# Patient Record
Sex: Male | Born: 1975 | Race: White | Hispanic: No | Marital: Married | Smoking: Never smoker
Health system: Southern US, Community
[De-identification: ages and names within clinical notes are randomized; demographics above are authoritative.]

## PROBLEM LIST (undated history)

## (undated) DIAGNOSIS — R7989 Other specified abnormal findings of blood chemistry: Secondary | ICD-10-CM

## (undated) DIAGNOSIS — J45909 Unspecified asthma, uncomplicated: Secondary | ICD-10-CM

## (undated) HISTORY — DX: Other specified abnormal findings of blood chemistry: R79.89

## (undated) HISTORY — DX: Unspecified asthma, uncomplicated: J45.909

---

## 2015-12-12 ENCOUNTER — Ambulatory Visit: Payer: Self-pay | Admitting: Urology

## 2015-12-25 ENCOUNTER — Encounter: Payer: Self-pay | Admitting: Urology

## 2015-12-25 ENCOUNTER — Ambulatory Visit (INDEPENDENT_AMBULATORY_CARE_PROVIDER_SITE_OTHER): Payer: 59 | Admitting: Urology

## 2015-12-25 VITALS — BP 144/89 | HR 62 | Ht 69.0 in | Wt 186.4 lb

## 2015-12-25 DIAGNOSIS — N529 Male erectile dysfunction, unspecified: Secondary | ICD-10-CM

## 2015-12-25 DIAGNOSIS — Z8042 Family history of malignant neoplasm of prostate: Secondary | ICD-10-CM

## 2015-12-25 DIAGNOSIS — N401 Enlarged prostate with lower urinary tract symptoms: Secondary | ICD-10-CM

## 2015-12-25 DIAGNOSIS — R7989 Other specified abnormal findings of blood chemistry: Secondary | ICD-10-CM

## 2015-12-25 DIAGNOSIS — E291 Testicular hypofunction: Secondary | ICD-10-CM

## 2015-12-25 DIAGNOSIS — N528 Other male erectile dysfunction: Secondary | ICD-10-CM | POA: Diagnosis not present

## 2015-12-25 DIAGNOSIS — N138 Other obstructive and reflux uropathy: Secondary | ICD-10-CM

## 2015-12-25 NOTE — Progress Notes (Signed)
12/25/2015 9:09 AM   Netty Starring 06/23/1975 [id number]  Referring provider: No referring provider defined for this encounter.  Chief Complaint  Patient presents with  . New Patient (Initial Visit)    Low Testosterone referred by Dr. Dareen Piano    HPI: Patient is a 40 year old Caucasian male who is referred to [place] by Dr. Dareen Piano for low testosterone.  Hypogonadism Patient is experiencing a decrease in libido, erections being less strong and falling asleep.   This is indicated by his responses to the ADAM questionnaire.   He is still having/no longer having spontaneous erections at night.   He denies sleep apnea.   He was managing his hypogonadism with testosterone cypionate which he injected himself.  He last shot was 4 months ago.        Androgen Deficiency in the Aging Male    Row Name 12/25/15 0800         Androgen Deficiency in the Aging Male   Do you have a decrease in libido (sex drive) Yes     Do you have lack of energy No     Do you have a decrease in strength and/or endurance No     Have you lost height No     Have you noticed a decreased "enjoyment of life" No     Are you sad and/or grumpy No     Are your erections less strong Yes     Have you noticed a recent deterioration in your ability to play sports No     Are you falling asleep after dinner Yes     Has there been a recent deterioration in your work performance No        BPH WITH LUTS His IPSS score today is 1, which is mild lower urinary tract symptomatology. He is delighted with his quality life due to his urinary symptoms.  His major complaint today is frequency, but it has not bothersome to him.  He has had these symptoms for a few years.  He denies any dysuria, hematuria or suprapubic pain.   He also denies any recent fevers, chills, nausea or vomiting.  His father was diagnosed with prostate cancer at age 6.  He underwent a prostatectomy.   No radiation.  No ADT.  He is still alive.  His paternal  uncles also have prostate cancer.        IPSS    Row Name 12/25/15 0800         International Prostate Symptom Score   How often have you had the sensation of not emptying your bladder? Not at All     How often have you had to urinate less than every two hours? Less than 1 in 5 times     How often have you found you stopped and started again several times when you urinated? Not at All     How often have you found it difficult to postpone urination? Not at All     How often have you had a weak urinary stream? Not at All     How often have you had to strain to start urination? Not at All     How many times did you typically get up at night to urinate? None     Total IPSS Score 1       Quality of Life due to urinary symptoms   If you were to spend the rest of your life with your urinary condition just  the way it is now how would you feel about that? Pleased        Score:  1-7 Mild 8-19 Moderate 20-35 Severe  Erectile dysfunction His SHIM score is 5, which is severe ED.   He has been having difficulty with erections for several years.   His major complaint is no erections.  His libido is diminished.   His risk factors for ED are age, BPH, hypogonadism and HTN.   He denies any painful erections or curvatures with his erections.   He has tried Viagra in the past, but it caused a very severe headache.        SHIM    Row Name 12/25/15 [phone]         SHIM: Over the last 6 months:   How do you rate your confidence that you could get and keep an erection? Very Low     When you had erections with sexual stimulation, how often were your erections hard enough for penetration (entering your partner)? Almost Never or Never     During sexual intercourse, how often were you able to maintain your erection after you had penetrated (entered) your partner? Extremely Difficult     During sexual intercourse, how difficult was it to maintain your erection to completion of intercourse? Extremely  Difficult     When you attempted sexual intercourse, how often was it satisfactory for you? Extremely Difficult       SHIM Total Score   SHIM 5        Score: 1-7 Severe ED 8-11 Moderate ED 12-16 Mild-Moderate ED 17-21 Mild ED 22-25 No ED   PMH: Past Medical History:  Diagnosis Date  . Asthma   . Low testosterone     Surgical History: History reviewed. No pertinent surgical history.  Home Medications:    Medication List       Accurate as of 12/25/15  9:09 AM. Always use your most recent med list.          albuterol 108 (90 Base) MCG/ACT inhaler Commonly known as:  PROVENTIL HFA;VENTOLIN HFA Inhale into the lungs.   DEPO-TESTOSTERONE 200 MG/ML injection Generic drug:  testosterone cypionate 1 mL every 14 (fourteen) days.   Loratadine 10 MG Caps Take by mouth.   sildenafil 20 MG tablet Commonly known as:  REVATIO Take 20 mg by mouth.       Allergies: No Known Allergies  Family History: Family History  Problem Relation Age of Onset  . Prostate cancer Father   . Prostate cancer Paternal Uncle     x 5  . Kidney disease Neg Hx     Social History:  reports that he has never smoked. He has never used smokeless tobacco. He reports that he does not drink alcohol or use drugs.  ROS: UROLOGY Frequent Urination?: No Hard to postpone urination?: No Burning/pain with urination?: No Get up at night to urinate?: No Leakage of urine?: No Urine stream starts and stops?: No Trouble starting stream?: No Do you have to strain to urinate?: No Blood in urine?: No Urinary tract infection?: No Sexually transmitted disease?: No Injury to kidneys or bladder?: No Painful intercourse?: No Weak stream?: No Erection problems?: Yes Penile pain?: No  Gastrointestinal Nausea?: No Vomiting?: No Indigestion/heartburn?: No Diarrhea?: No Constipation?: No  Constitutional Fever: No Night sweats?: No Weight loss?: No Fatigue?: No  Skin Skin rash/lesions?:  No Itching?: No  Eyes Blurred vision?: No Double vision?: No  Ears/Nose/Throat Sore throat?: No Sinus  problems?: No  Hematologic/Lymphatic Swollen glands?: No Easy bruising?: No  Cardiovascular Leg swelling?: No Chest pain?: No  Respiratory Cough?: No Shortness of breath?: No  Endocrine Excessive thirst?: No  Musculoskeletal Back pain?: No Joint pain?: No  Neurological Headaches?: No Dizziness?: No  Psychologic Depression?: No Anxiety?: No  Physical Exam: BP (!) 144/89   Pulse 62   Ht 5\' 9"  (1.753 m)   Wt 186 lb 6.4 oz (84.6 kg)   BMI 27.53 kg/m   Constitutional: Well nourished. Alert and oriented, No acute distress. HEENT: [place] AT, moist mucus membranes. Trachea midline, no masses. Cardiovascular: No clubbing, cyanosis, or edema. Respiratory: Normal respiratory effort, no increased work of breathing. GI: Abdomen is soft, non tender, non distended, no abdominal masses. Liver and spleen not palpable.  No hernias appreciated.  Stool sample for occult testing is not indicated.   GU: No CVA tenderness.  No bladder fullness or masses.  Patient with circumcised phallus.  Urethral meatus is patent.  No penile discharge. No penile lesions or rashes. Scrotum without lesions, cysts, rashes and/or edema.  Testicles are located scrotally bilaterally. No masses are appreciated in the testicles. Left and right epididymis are normal. Rectal: Patient with  normal sphincter tone. Anus and perineum without scarring or rashes. No rectal masses are appreciated. Prostate is approximately 45 grams, no nodules are appreciated. Seminal vesicles are normal. Skin: No rashes, bruises or suspicious lesions. Lymph: No cervical or inguinal adenopathy. Neurologic: Grossly intact, no focal deficits, moving all 4 extremities. Psychiatric: Normal mood and affect.    Assessment & Plan:    1. Low testosterone  - I explained to patient that the current recommendations from the Endocrine  Society reports the diagnosis of hypogonadism requires a serum total testosterone level obtained between 8 and 10 AM at least 2 days apart that is below the laboratory parameters  for normal testosterone.      - At this time, the patient does not meet this requirement.  Morning testosterone drawn today.  He will then return next week for his second serum testosterone draw before 10 AM.  -I discussed with the patient the side effects of testosterone therapy, such as: enlargement of the prostate gland that may in turn cause LUTS, possible increased risk of PCa, DVT's and/or PE's, possible increased risk of heart attack or stroke, lower sperm count, swelling of the ankles, feet, or body, with or without heart failure, enlarged or painful breasts, have problems breathing while you sleep (sleep apnea), increased prostate specific antigen, mood swings, hypertension and increased red blood cell count.  - I also discussed that some men have had success using clomid for hypogonadism.  It does seem to be more successful in younger men, but there are incidences of good results in middle-aged men.  I explained that it is used in male infertility to stimulate the testicles to make more testosterone/sperm.  There has been no long term data on side effects, but some urologists has been having success with this medication.   - Testosterone  - PSA  - Hematocrit  - Hepatic function panel  - He will speak with Dr. Dareen Piano regarding his risks for sleep apnea   2. BPH with LUTS  - IPSS score is 1/0  - Continue conservative management, avoiding bladder irritants and timed voiding's  - RTC in 6 months for IPSS, PSA and exam   3. Family history of prostate cancer  - RTC in 6 months for PSA and exam  4. Erectile  dysfunction:  SHIM score is 5.   I explained to the patient that in order to achieve an erection it takes good functioning of the nervous system (parasympathetic, sympathetic, sensory and motor), good blood  flow into the erectile tissue of the penis and a desire to have sex.   I stated that conditions like diabetes, hypertension, coronary artery disease, peripheral vascular disease, smoking, alcohol consumption, age and BPH can diminish the ability to have an erection.   We discussed trying a different PDE5 inhibitor, intra-urethral suppositories, intracavernous vasoactive drug injection therapy, vacuum constriction device and penile prosthesis implantation.    - samples given for Stendra  - RTC in 6 months for SHIM and exam    Return for next week before 10AM for testosterone draw.  These notes generated with voice recognition software. I apologize for typographical errors.  Michiel CowboySHANNON Dequita Schleicher, PA-C  [facility] [address], [address] [place], [place] [address] [phone]

## 2015-12-26 ENCOUNTER — Telehealth: Payer: Self-pay

## 2015-12-26 LAB — HEMATOCRIT: Hematocrit: 45.9 % (ref 37.5–51.0)

## 2015-12-26 LAB — HEPATIC FUNCTION PANEL
ALBUMIN: 4.8 g/dL (ref 3.5–5.5)
ALT: 38 IU/L (ref 0–44)
AST: 23 IU/L (ref 0–40)
Alkaline Phosphatase: 84 IU/L (ref 39–117)
BILIRUBIN TOTAL: 0.9 mg/dL (ref 0.0–1.2)
Bilirubin, Direct: 0.2 mg/dL (ref 0.00–0.40)
Total Protein: 7.2 g/dL (ref 6.0–8.5)

## 2015-12-26 LAB — TESTOSTERONE: Testosterone: 396 ng/dL (ref 264–916)

## 2015-12-26 LAB — PSA: PROSTATE SPECIFIC AG, SERUM: 0.8 ng/mL (ref 0.0–4.0)

## 2015-12-26 NOTE — Telephone Encounter (Signed)
Spoke with pt in reference to lab results. Made pt aware testosterone result is normal at this time. Reinforced with pt to keep next lab appt. Pt voiced understanding.

## 2015-12-26 NOTE — Telephone Encounter (Signed)
-----   Message from Harle BattiestShannon A McGowan, PA-C sent at 12/26/2015  8:30 AM EDT ----- Patient's PSA and LFT's are normal.  His testosterone is also in the normal range, so it doesn't meet the criteria for low testosterone.  I believe he is coming in next week for a second testosterone draw.  He should keep that lab appointment.

## 2015-12-26 NOTE — Telephone Encounter (Signed)
LMOM

## 2015-12-30 ENCOUNTER — Other Ambulatory Visit: Payer: 59

## 2015-12-30 ENCOUNTER — Other Ambulatory Visit: Payer: Self-pay

## 2015-12-30 DIAGNOSIS — E291 Testicular hypofunction: Secondary | ICD-10-CM

## 2015-12-31 ENCOUNTER — Telehealth: Payer: Self-pay

## 2015-12-31 LAB — TESTOSTERONE: TESTOSTERONE: 379 ng/dL (ref 264–916)

## 2015-12-31 NOTE — Telephone Encounter (Signed)
Spoke with pt in reference to lab results and sleep study. Pt voiced understanding. Pt stated that he will call PCP and then let us know.

## 2015-12-31 NOTE — Telephone Encounter (Signed)
LMOM

## 2015-12-31 NOTE — Telephone Encounter (Signed)
-----   Message from Harle BattiestShannon A McGowan, PA-C sent at 12/31/2015 12:56 PM EDT ----- Testosterone is still within the normal lab parameters, he does not meet the criteria for low testosterone.  I suggest that he be evaluated for sleep apnea and not take any testosterone medication until this evaluation is complete.

## 2016-01-24 ENCOUNTER — Emergency Department: Payer: No Typology Code available for payment source

## 2016-01-24 ENCOUNTER — Emergency Department
Admission: EM | Admit: 2016-01-24 | Discharge: 2016-01-24 | Disposition: A | Discharge status: Home / Self Care | Payer: No Typology Code available for payment source | Attending: Emergency Medicine | Admitting: Emergency Medicine

## 2016-01-24 ENCOUNTER — Encounter: Payer: Self-pay | Admitting: *Deleted

## 2016-01-24 DIAGNOSIS — Z79899 Other long term (current) drug therapy: Secondary | ICD-10-CM | POA: Diagnosis not present

## 2016-01-24 DIAGNOSIS — T22012A Burn of unspecified degree of left forearm, initial encounter: Secondary | ICD-10-CM | POA: Insufficient documentation

## 2016-01-24 DIAGNOSIS — S51812A Laceration without foreign body of left forearm, initial encounter: Secondary | ICD-10-CM | POA: Diagnosis not present

## 2016-01-24 DIAGNOSIS — G8911 Acute pain due to trauma: Secondary | ICD-10-CM

## 2016-01-24 DIAGNOSIS — M25562 Pain in left knee: Secondary | ICD-10-CM | POA: Diagnosis not present

## 2016-01-24 DIAGNOSIS — Y9389 Activity, other specified: Secondary | ICD-10-CM | POA: Diagnosis not present

## 2016-01-24 DIAGNOSIS — S39012A Strain of muscle, fascia and tendon of lower back, initial encounter: Secondary | ICD-10-CM

## 2016-01-24 DIAGNOSIS — Y92410 Unspecified street and highway as the place of occurrence of the external cause: Secondary | ICD-10-CM | POA: Insufficient documentation

## 2016-01-24 DIAGNOSIS — Y999 Unspecified external cause status: Secondary | ICD-10-CM | POA: Diagnosis not present

## 2016-01-24 DIAGNOSIS — J45909 Unspecified asthma, uncomplicated: Secondary | ICD-10-CM | POA: Insufficient documentation

## 2016-01-24 DIAGNOSIS — S299XXA Unspecified injury of thorax, initial encounter: Secondary | ICD-10-CM | POA: Diagnosis present

## 2016-01-24 DIAGNOSIS — S2231XA Fracture of one rib, right side, initial encounter for closed fracture: Secondary | ICD-10-CM | POA: Diagnosis not present

## 2016-01-24 MED ORDER — NAPROXEN 500 MG PO TABS
ORAL_TABLET | ORAL | Status: AC
  Filled 2016-01-24: qty 1

## 2016-01-24 MED ORDER — CYCLOBENZAPRINE HCL 10 MG PO TABS
ORAL_TABLET | ORAL | Status: AC
  Filled 2016-01-24: qty 1

## 2016-01-24 MED ORDER — NAPROXEN 500 MG PO TABS: 500.0000 mg | ORAL_TABLET | Freq: Two times a day (BID) | ORAL | 0 refills | Status: DC

## 2016-01-24 MED ORDER — CYCLOBENZAPRINE HCL 10 MG PO TABS: 10.0000 mg | ORAL_TABLET | Freq: Three times a day (TID) | ORAL | 0 refills | Status: DC | PRN

## 2016-01-24 MED ORDER — CYCLOBENZAPRINE HCL 10 MG PO TABS
10.0000 mg | ORAL_TABLET | Freq: Once | ORAL | Status: AC
  Administered 2016-01-24: 10 mg via ORAL

## 2016-01-24 MED ORDER — NAPROXEN 500 MG PO TABS
500.0000 mg | ORAL_TABLET | Freq: Once | ORAL | Status: AC
  Administered 2016-01-24: 500 mg via ORAL
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
## 2016-05-21 ENCOUNTER — Other Ambulatory Visit: Payer: Self-pay
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
## 2018-07-11 DIAGNOSIS — Z0000 Encounter for general adult medical examination without abnormal findings: Secondary | ICD-10-CM | POA: Diagnosis not present

## 2018-07-11 DIAGNOSIS — R5383 Other fatigue: Secondary | ICD-10-CM | POA: Diagnosis not present

## 2018-07-11 DIAGNOSIS — J4520 Mild intermittent asthma, uncomplicated: Secondary | ICD-10-CM | POA: Diagnosis not present

## 2019-10-19 NOTE — Progress Notes (Signed)
10/20/2019 9:34 AM   Netty Starring [birth year] [id number]  Referring provider: Lauro Regulus, MD [address] [facility] [place],  [place] [address] Chief Complaint  Patient presents with  . Hypogonadism    HPI: Dean Robinson is a 44 y.o. male presents today to establish local urologic care  -He was followed by [facility] urologist Dr. Achilles Dunk from 2014-2016.  -He is a former patient of Michiel Cowboy, PA-C and last seen on 12/25/2015 for low testosterone. -In 2017 he reported using cypionate self injections. Today he feels fatigued and has decreased libido. -He has a history of BPH with LUTS and erectile dysfunction. -His ED symptoms are worsening. He has tried testosterone gels, viagra and cialis. Viagra and Cialis caused severe headaches. He notes cost was an issue. -He has partial erections and is unable to penetrate.  -He has a personal family history of prostate cancer. His father was diagnosed at age 5. 74/6 brothers were diagnosed with prostate cancer; one being as young as 24. -Work up on 12/25/2015 showed hematocrit 45.9, testosterone 396, PSA 0.8, hepatic panel was normal.    PMH: Past Medical History:  Diagnosis Date  . Asthma   . Low testosterone     Surgical History: History reviewed. No pertinent surgical history.  Home Medications:  Allergies as of 10/20/2019   No Known Allergies     Medication List       Accurate as of October 20, 2019  9:34 AM. If you have any questions, ask your nurse or doctor.        STOP taking these medications   Depo-Testosterone 200 MG/ML injection Generic drug: testosterone cypionate Stopped by: Riki Altes, MD   sildenafil 20 MG tablet Commonly known as: REVATIO Stopped by: Riki Altes, MD     TAKE these medications   albuterol 108 (90 Base) MCG/ACT inhaler Commonly known as: VENTOLIN HFA Inhale into the lungs.   cyclobenzaprine 10 MG tablet Commonly known as: FLEXERIL Take 1 tablet (10  mg total) by mouth 3 (three) times daily as needed for muscle spasms.   Loratadine 10 MG Caps Take by mouth.   naproxen 500 MG tablet Commonly known as: Naprosyn Take 1 tablet (500 mg total) by mouth 2 (two) times daily with a meal.       Allergies: No Known Allergies  Family History: Family History  Problem Relation Age of Onset  . Prostate cancer Father   . Prostate cancer Paternal Uncle        x 5  . Kidney disease Neg Hx     Social History:  reports that he has never smoked. He has never used smokeless tobacco. He reports that he does not drink alcohol and does not use drugs.   Physical Exam: BP (!) 150/91   Pulse 82   Ht 5\' 9"  (1.753 m)   Wt 200 lb (90.7 kg)   BMI 29.53 kg/m   Constitutional:  Alert and oriented, No acute distress. HEENT: [place] AT, moist mucus membranes.  Trachea midline, no masses. Cardiovascular: No clubbing, cyanosis, or edema. Respiratory: Normal respiratory effort, no increased work of breathing. GI: Abdomen is soft, nontender, nondistended, no abdominal masses GU: Phallus without lesions, testes descended bilaterally without masses or tenderness, normal size bilaterally.  Spermatic cord/epididymis palpably normal bilaterally Rectal: Normal sphincter tone, 35 g prostate, no nodules/tenderness. Lymph: No cervical or inguinal lymphadenopathy. Skin: No rashes, bruises or suspicious lesions. Neurologic: Grossly intact, no focal deficits, moving all  4 extremities. Psychiatric: Normal mood and affect.    Assessment & Plan:    1. Family history of prostate cancer He has a personal family history of prostate cancer. His father was diagnosed at age 31. 80/6 brothers were diagnosed with prostate cancer; one being as young as 75. PSA today, will call with results.  2. Hypogonadism  Previously on topical gels and testosterone injections He has bothersome symptoms. Testosterone, LH and prolactin today will call with results.   3. Erectile  dysfunction His ED symptoms are worsening. He has tried viagra and cialis. Viagra and Cialis caused severe headaches.  He notes cost is an issue and would like a repeat trial of tadalafil.   Intracavernosal injections and penile implant surgery was also discussed   [facility] [address], [address] [place], [place] [address] [phone]  I, Theador Hawthorne, am acting as a scribe for Dr. Lorin Picket C. Texanna Hilburn,  I have reviewed the above documentation for accuracy and completeness, and I agree with the above.   Riki Altes, MD

## 2019-10-20 ENCOUNTER — Ambulatory Visit: Payer: Commercial Managed Care - PPO | Admitting: Urology

## 2019-10-20 ENCOUNTER — Encounter: Payer: Self-pay | Admitting: Urology

## 2019-10-20 ENCOUNTER — Other Ambulatory Visit: Payer: Self-pay

## 2019-10-20 VITALS — BP 150/91 | HR 82 | Ht 69.0 in | Wt 200.0 lb

## 2019-10-20 DIAGNOSIS — N400 Enlarged prostate without lower urinary tract symptoms: Secondary | ICD-10-CM | POA: Diagnosis not present

## 2019-10-20 DIAGNOSIS — E291 Testicular hypofunction: Secondary | ICD-10-CM | POA: Diagnosis not present

## 2019-10-20 DIAGNOSIS — N529 Male erectile dysfunction, unspecified: Secondary | ICD-10-CM | POA: Diagnosis not present

## 2019-10-20 DIAGNOSIS — Z8042 Family history of malignant neoplasm of prostate: Secondary | ICD-10-CM | POA: Diagnosis not present

## 2019-10-20 MED ORDER — TADALAFIL 20 MG PO TABS: ORAL_TABLET | ORAL | 0 refills | Status: DC

## 2019-10-21 LAB — PSA: Prostate Specific Ag, Serum: 0.7 ng/mL (ref 0.0–4.0)

## 2019-10-21 LAB — LUTEINIZING HORMONE: LH: 5.7 m[IU]/mL (ref 1.7–8.6)

## 2019-10-21 LAB — TESTOSTERONE: Testosterone: 305 ng/dL (ref 264–916)

## 2019-10-21 LAB — PROLACTIN: Prolactin: 6.3 ng/mL (ref 4.0–15.2)

## 2019-10-23 ENCOUNTER — Telehealth: Payer: Self-pay | Admitting: *Deleted

## 2019-10-23 NOTE — Telephone Encounter (Signed)
Talked with [facility] , They will run free testosterone  profile 1

## 2019-10-23 NOTE — Telephone Encounter (Signed)
-----   Message from Riki Altes, MD sent at 10/22/2019 10:44 AM EDT ----- Testosterone level low normal at 305.  Insurance would not cover testosterone replacement with a level in this range.  Recommend a free testosterone  profile 1-see if this can be run on the most recent serum.  LH, prolactin and PSA were all normal

## 2019-10-24 ENCOUNTER — Telehealth: Payer: Self-pay | Admitting: Urology

## 2019-10-24 LAB — TESTOSTERONE FREE, PROFILE I
Albumin: 4.5 g/dL (ref 4.0–5.0)
Sex Hormone Binding: 24.3 nmol/L (ref 16.5–55.9)
Testost., Free, Calc: 71.2 pg/mL (ref 30.3–183.2)
Testosterone: 313 ng/dL (ref 264–916)

## 2019-10-24 LAB — SPECIMEN STATUS REPORT

## 2019-10-24 NOTE — Telephone Encounter (Signed)
Free and total testosterone levels are low normal.  Insurance will not cover testosterone replacement for levels in the low normal range.  Can consider a trial of Clomid to see if this improves his symptoms.

## 2019-10-26 NOTE — Telephone Encounter (Signed)
Notified patient as instructed, patient states you may send it to [facility]

## 2019-10-27 MED ORDER — CLOMIPHENE CITRATE 50 MG PO TABS: 25.0000 mg | ORAL_TABLET | Freq: Every day | ORAL | 0 refills | Status: DC

## 2019-10-27 NOTE — Telephone Encounter (Signed)
Left message on vm

## 2019-10-27 NOTE — Telephone Encounter (Signed)
Rx sent.  Please schedule 6-week follow-up office visit with testosterone level prior.

## 2019-12-07 ENCOUNTER — Other Ambulatory Visit: Payer: Self-pay

## 2019-12-07 DIAGNOSIS — E291 Testicular hypofunction: Secondary | ICD-10-CM

## 2019-12-08 ENCOUNTER — Other Ambulatory Visit: Payer: Self-pay

## 2019-12-08 ENCOUNTER — Other Ambulatory Visit: Payer: Commercial Managed Care - PPO

## 2019-12-08 DIAGNOSIS — E291 Testicular hypofunction: Secondary | ICD-10-CM

## 2019-12-09 LAB — TESTOSTERONE: Testosterone: 466 ng/dL (ref 264–916)

## 2019-12-13 ENCOUNTER — Ambulatory Visit: Payer: Commercial Managed Care - PPO | Admitting: Urology

## 2019-12-13 ENCOUNTER — Encounter: Payer: Self-pay | Admitting: Urology

## 2019-12-13 ENCOUNTER — Other Ambulatory Visit: Payer: Self-pay

## 2019-12-13 DIAGNOSIS — E291 Testicular hypofunction: Secondary | ICD-10-CM | POA: Diagnosis not present

## 2019-12-13 DIAGNOSIS — N529 Male erectile dysfunction, unspecified: Secondary | ICD-10-CM

## 2019-12-13 NOTE — Progress Notes (Signed)
   12/13/2019 11:17 AM   Dean Robinson Mar 17, 1976 [id number]  Referring provider: Lauro Regulus, MD [address] [facility] [place],  [place] [address]  Chief Complaint  Patient presents with  . Hypogonadism    HPI: 44 y.o. male presents for follow-up.   Initially seen 10/20/2019 with a history of hypogonadism on testosterone cypionate  Primary complaints were tiredness, fatigue and worsening ED  Testosterone levels were low normal and TRT would not be covered by insurance  His LH was normal and he elected a trial of Clomid  He does note some improvement and energy level  He was given a trial of tadalafil and used it only once without improvement  He subsequently was diagnosed with Covid and has not attempted additional dosing  His first use was prior to starting Clomid  Testosterone level 12/08/2019 improved at 466 (prior 305, 313)   PMH: Past Medical History:  Diagnosis Date  . Asthma   . Low testosterone     Surgical History: No past surgical history on file.  Home Medications:  Allergies as of 12/13/2019   No Known Allergies     Medication List       Accurate as of December 13, 2019 11:17 AM. If you have any questions, ask your nurse or doctor.        albuterol 108 (90 Base) MCG/ACT inhaler Commonly known as: VENTOLIN HFA Inhale into the lungs.   clomiPHENE 50 MG tablet Commonly known as: CLOMID Take 0.5 tablets (25 mg total) by mouth daily.   cyclobenzaprine 10 MG tablet Commonly known as: FLEXERIL Take 1 tablet (10 mg total) by mouth 3 (three) times daily as needed for muscle spasms.   Loratadine 10 MG Caps Take by mouth.   naproxen 500 MG tablet Commonly known as: Naprosyn Take 1 tablet (500 mg total) by mouth 2 (two) times daily with a meal.   tadalafil 20 MG tablet Commonly known as: CIALIS 1 tab 1 hour prior to intercourse       Allergies: No Known Allergies  Family History: Family History  Problem  Relation Age of Onset  . Prostate cancer Father   . Prostate cancer Paternal Uncle        x 5  . Kidney disease Neg Hx     Social History:  reports that he has never smoked. He has never used smokeless tobacco. He reports that he does not drink alcohol and does not use drugs.   Physical Exam: BP (!) 145/80   Pulse 72   Ht 5\' 9"  (1.753 m)   Wt 190 lb (86.2 kg)   BMI 28.06 kg/m   Constitutional:  Alert and oriented, No acute distress. HEENT: [place] AT, moist mucus membranes.  Trachea midline, no masses. Cardiovascular: No clubbing, cyanosis, or edema. Respiratory: Normal respiratory effort, no increased work of breathing. Skin: No rashes, bruises or suspicious lesions. Neurologic: Grossly intact, no focal deficits, moving all 4 extremities. Psychiatric: Normal mood and affect.   Assessment & Plan:    1.  Hypogonadism  Has noted improvement on clomiphene which he desires to continue  2.  Erectile dysfunction  Recommended he use tadalafil on at least 5 occasions to determine efficacy  Follow-up 4 months with testosterone, PSA, hematocrit   , MD  [facility] [address] [place], [place] [phone]

## 2019-12-14 ENCOUNTER — Encounter: Payer: Self-pay | Admitting: Urology

## 2019-12-14 DIAGNOSIS — N529 Male erectile dysfunction, unspecified: Secondary | ICD-10-CM | POA: Insufficient documentation

## 2019-12-14 DIAGNOSIS — E291 Testicular hypofunction: Secondary | ICD-10-CM | POA: Insufficient documentation

## 2020-02-19 ENCOUNTER — Other Ambulatory Visit: Payer: Self-pay | Admitting: *Deleted

## 2020-02-19 NOTE — Telephone Encounter (Signed)
Ok to fill 

## 2020-02-20 MED ORDER — CLOMIPHENE CITRATE 50 MG PO TABS: 25.0000 mg | ORAL_TABLET | Freq: Every day | ORAL | 1 refills | Status: DC

## 2020-04-12 ENCOUNTER — Other Ambulatory Visit: Payer: Self-pay

## 2020-04-12 ENCOUNTER — Other Ambulatory Visit: Payer: Commercial Managed Care - PPO

## 2020-04-12 DIAGNOSIS — E291 Testicular hypofunction: Secondary | ICD-10-CM

## 2020-04-13 LAB — HEMATOCRIT: Hematocrit: 47.2 % (ref 37.5–51.0)

## 2020-04-13 LAB — TESTOSTERONE: Testosterone: 644 ng/dL (ref 264–916)

## 2020-04-13 LAB — PSA: Prostate Specific Ag, Serum: 0.6 ng/mL (ref 0.0–4.0)

## 2020-04-15 ENCOUNTER — Encounter: Payer: Self-pay | Admitting: Urology

## 2020-04-15 ENCOUNTER — Other Ambulatory Visit: Payer: Self-pay

## 2020-04-15 ENCOUNTER — Ambulatory Visit (INDEPENDENT_AMBULATORY_CARE_PROVIDER_SITE_OTHER): Payer: Commercial Managed Care - PPO | Admitting: Urology

## 2020-04-15 VITALS — BP 139/88 | HR 77 | Ht 69.0 in | Wt 190.0 lb

## 2020-04-15 DIAGNOSIS — N529 Male erectile dysfunction, unspecified: Secondary | ICD-10-CM

## 2020-04-15 DIAGNOSIS — E291 Testicular hypofunction: Secondary | ICD-10-CM

## 2020-04-15 MED ORDER — TADALAFIL 20 MG PO TABS: ORAL_TABLET | ORAL | 3 refills | Status: DC

## 2020-04-15 MED ORDER — CLOMIPHENE CITRATE 50 MG PO TABS: 25.0000 mg | ORAL_TABLET | Freq: Every day | ORAL | 3 refills | Status: DC

## 2020-04-15 NOTE — Progress Notes (Signed)
   04/15/2020 11:32 AM   Dean Robinson [birth year] [id number]  Referring provider: Lauro Regulus, MD [address] [facility] [place],  [place] [address]  Chief Complaint  Patient presents with  . Hypogonadism    Urologic history:  1.  Hypogonadism -Symptoms tiredness, fatigue -Low normal testosterone levels improved on Clomid with symptom improvement  2.  Erectile dysfunction -Tadalafil prn   HPI: 45 y.o. male presents for follow-up of hypogonadism and ED.   Remains on Clomid with good energy level  Tadalafil has been effective with much less headache than sildenafil  No bothersome LUTS  Labs 04/12/2020: Testosterone 644; PSA 0.6; hematocrit 47.2   PMH: Past Medical History:  Diagnosis Date  . Asthma   . Low testosterone     Surgical History: No past surgical history on file.  Home Medications:  Allergies as of 04/15/2020   No Known Allergies     Medication List       Accurate as of April 15, 2020 11:32 AM. If you have any questions, ask your nurse or doctor.        albuterol 108 (90 Base) MCG/ACT inhaler Commonly known as: VENTOLIN HFA Inhale into the lungs.   clomiPHENE 50 MG tablet Commonly known as: CLOMID Take 0.5 tablets (25 mg total) by mouth daily.   cyclobenzaprine 10 MG tablet Commonly known as: FLEXERIL Take 1 tablet (10 mg total) by mouth 3 (three) times daily as needed for muscle spasms.   Loratadine 10 MG Caps Take by mouth.   naproxen 500 MG tablet Commonly known as: Naprosyn Take 1 tablet (500 mg total) by mouth 2 (two) times daily with a meal.   tadalafil 20 MG tablet Commonly known as: CIALIS 1 tab 1 hour prior to intercourse       Allergies: No Known Allergies  Family History: Family History  Problem Relation Age of Onset  . Prostate cancer Father   . Prostate cancer Paternal Uncle        x 5  . Kidney disease Neg Hx     Social History:  reports that he has never smoked. He has  never used smokeless tobacco. He reports that he does not drink alcohol and does not use drugs.   Physical Exam: BP 139/88 (BP Location: Left Arm, Patient Position: Sitting, Cuff Size: Large)   Pulse 77   Ht 5\' 9"  (1.753 m)   BMI 28.06 kg/m   Constitutional:  Alert and oriented, No acute distress. HEENT: [place] AT, moist mucus membranes.  Trachea midline, no masses. Cardiovascular: No clubbing, cyanosis, or edema. Respiratory: Normal respiratory effort, no increased work of breathing. Skin: No rashes, bruises or suspicious lesions. Neurologic: Grossly intact, no focal deficits, moving all 4 extremities. Psychiatric: Normal mood and affect.   Assessment & Plan:    1.  Hypogonadism  Doing well on Clomid which he desires to continue  1 year follow-up with labs and 69-month lab visit for testosterone level  Clomid refilled  2.  Erectile dysfunction  Tadalafil refilled   8-month, MD  [facility] [address] [place], [place] [id number]

## 2020-10-14 ENCOUNTER — Other Ambulatory Visit: Payer: Self-pay

## 2020-10-14 ENCOUNTER — Other Ambulatory Visit: Payer: Managed Care, Other (non HMO)

## 2020-10-14 DIAGNOSIS — E291 Testicular hypofunction: Secondary | ICD-10-CM

## 2020-10-15 ENCOUNTER — Telehealth: Payer: Self-pay | Admitting: *Deleted

## 2020-10-15 LAB — PSA: Prostate Specific Ag, Serum: 0.6 ng/mL (ref 0.0–4.0)

## 2020-10-15 LAB — TESTOSTERONE: Testosterone: 464 ng/dL (ref 264–916)

## 2020-10-15 LAB — HEMATOCRIT: Hematocrit: 48.9 % (ref 37.5–51.0)

## 2020-10-15 NOTE — Telephone Encounter (Signed)
-----   Message from Riki Altes, MD sent at 10/15/2020  2:38 PM EDT ----- Testosterone level looks good at 464.  PSA stable at 0.6 and hematocrit was normal

## 2020-10-15 NOTE — Telephone Encounter (Signed)
Notified patient as instructed, patient pleased °

## 2021-03-17 ENCOUNTER — Other Ambulatory Visit: Payer: Self-pay | Admitting: Internal Medicine

## 2021-03-19 LAB — SURGICAL PATHOLOGY

## 2021-04-02 ENCOUNTER — Other Ambulatory Visit: Payer: Self-pay | Admitting: *Deleted

## 2021-04-02 DIAGNOSIS — N400 Enlarged prostate without lower urinary tract symptoms: Secondary | ICD-10-CM

## 2021-04-02 DIAGNOSIS — E291 Testicular hypofunction: Secondary | ICD-10-CM

## 2021-04-10 ENCOUNTER — Other Ambulatory Visit: Payer: Managed Care, Other (non HMO)

## 2021-04-10 ENCOUNTER — Other Ambulatory Visit: Payer: Self-pay

## 2021-04-10 DIAGNOSIS — E291 Testicular hypofunction: Secondary | ICD-10-CM

## 2021-04-10 DIAGNOSIS — N400 Enlarged prostate without lower urinary tract symptoms: Secondary | ICD-10-CM

## 2021-04-11 LAB — HEMATOCRIT: Hematocrit: 48.5 % (ref 37.5–51.0)

## 2021-04-11 LAB — PSA: Prostate Specific Ag, Serum: 0.8 ng/mL (ref 0.0–4.0)

## 2021-04-11 LAB — TESTOSTERONE: Testosterone: 409 ng/dL (ref 264–916)

## 2021-04-18 ENCOUNTER — Ambulatory Visit: Payer: Self-pay | Admitting: Urology

## 2021-05-08 ENCOUNTER — Ambulatory Visit: Payer: Managed Care, Other (non HMO) | Admitting: Urology

## 2021-05-08 ENCOUNTER — Other Ambulatory Visit: Payer: Self-pay

## 2021-05-08 ENCOUNTER — Encounter: Payer: Self-pay | Admitting: Urology

## 2021-05-08 VITALS — BP 143/82 | HR 89 | Ht 69.0 in | Wt 202.0 lb

## 2021-05-08 DIAGNOSIS — E291 Testicular hypofunction: Secondary | ICD-10-CM

## 2021-05-08 DIAGNOSIS — Z8042 Family history of malignant neoplasm of prostate: Secondary | ICD-10-CM | POA: Diagnosis not present

## 2021-05-08 NOTE — Progress Notes (Signed)
05/08/2021 3:23 PM   Dean Robinson October 26, 1975 [id number]  Referring provider: Lauro Regulus, MD [address] [facility] [place] - [place],  [place] [address]  Chief Complaint  Patient presents with   Follow-up    Urologic history:   1.  Hypogonadism -Symptoms tiredness, fatigue -Low normal testosterone levels improved on Clomid with symptom improvement   2.  Erectile dysfunction -Tadalafil prn  HPI: 46 y.o. male presents for annual follow-up.  His pharmacy contacted him with the use generic Clomid is no longer available and brand-name cost would run ~ $60 per month Labs 04/10/2021: Testosterone 409 ng/dL; PSA 0.8; Hematocrit 48.5 He had been off Clomid 1 week at the time of this blood draw He has not decided if he is going to continue Clomid.  He would initially like to discontinue the medication and see how he does.  He has been off for approximately 1 month and has not had recurrent symptoms of tiredness/fatigue   PMH: Past Medical History:  Diagnosis Date   Asthma    Low testosterone     Surgical History: History reviewed. No pertinent surgical history.  Home Medications:  Allergies as of 05/08/2021   No Known Allergies      Medication List        Accurate as of May 08, 2021  3:23 PM. If you have any questions, ask your nurse or doctor.          Advair HFA 115-21 MCG/ACT inhaler Generic drug: fluticasone-salmeterol Inhale 2 puffs into the lungs 2 (two) times daily.   albuterol 108 (90 Base) MCG/ACT inhaler Commonly known as: VENTOLIN HFA Inhale into the lungs.   clomiPHENE 50 MG tablet Commonly known as: CLOMID Take 0.5 tablets (25 mg total) by mouth daily.   cyclobenzaprine 10 MG tablet Commonly known as: FLEXERIL Take 1 tablet (10 mg total) by mouth 3 (three) times daily as needed for muscle spasms.   fexofenadine 180 MG tablet Commonly known as: ALLEGRA Take 180 mg by mouth daily.   fluticasone 50 MCG/ACT nasal  spray Commonly known as: FLONASE Place into the nose.   Loratadine 10 MG Caps Take by mouth.   montelukast 10 MG tablet Commonly known as: SINGULAIR Take 10 mg by mouth daily.   naproxen 500 MG tablet Commonly known as: Naprosyn Take 1 tablet (500 mg total) by mouth 2 (two) times daily with a meal.   tadalafil 20 MG tablet Commonly known as: CIALIS 1 tab 1 hour prior to intercourse        Allergies: No Known Allergies  Family History: Family History  Problem Relation Age of Onset   Prostate cancer Father    Prostate cancer Paternal Uncle        x 5   Kidney disease Neg Hx     Social History:  reports that he has never smoked. He has never used smokeless tobacco. He reports that he does not drink alcohol and does not use drugs.   Physical Exam: BP (!) 143/82    Pulse 89    Ht 5\' 9"  (1.753 m)    Wt 202 lb (91.6 kg)    BMI 29.83 kg/m   Constitutional:  Alert and oriented, No acute distress. Psychiatric: Normal mood and affect.   Assessment & Plan:    1.  Hypogonadism Should he experience recurrent low T symptoms he will call for a repeat testosterone level If he elects to restart Clomid he will call for lab monitoring  2.  Family history prostate cancer He desires to continue annual visits for PSA We will also check testosterone at that visit   Abbie Sons, [place] [address] [place], [place] [address] [phone]

## 2021-05-09 ENCOUNTER — Encounter: Payer: Self-pay | Admitting: Urology

## 2021-10-23 ENCOUNTER — Telehealth: Payer: Self-pay

## 2021-10-23 MED ORDER — TADALAFIL 20 MG PO TABS: ORAL_TABLET | ORAL | 3 refills | Status: AC

## 2021-10-23 NOTE — Telephone Encounter (Signed)
Pt left message, needs refill on cialis

## 2021-10-23 NOTE — Addendum Note (Signed)
Addended by: Riki Altes on: 10/23/2021 03:55 PM   Modules accepted: Orders

## 2022-05-08 ENCOUNTER — Other Ambulatory Visit: Payer: Self-pay

## 2022-05-08 ENCOUNTER — Other Ambulatory Visit: Payer: Managed Care, Other (non HMO)

## 2022-05-08 DIAGNOSIS — E291 Testicular hypofunction: Secondary | ICD-10-CM

## 2022-05-08 DIAGNOSIS — N400 Enlarged prostate without lower urinary tract symptoms: Secondary | ICD-10-CM

## 2022-05-11 ENCOUNTER — Other Ambulatory Visit: Payer: Managed Care, Other (non HMO)

## 2022-05-11 DIAGNOSIS — N400 Enlarged prostate without lower urinary tract symptoms: Secondary | ICD-10-CM

## 2022-05-11 DIAGNOSIS — E291 Testicular hypofunction: Secondary | ICD-10-CM

## 2022-05-12 LAB — TESTOSTERONE: Testosterone: 317 ng/dL (ref 264–916)

## 2022-05-12 LAB — PSA: Prostate Specific Ag, Serum: 0.6 ng/mL (ref 0.0–4.0)

## 2022-05-14 ENCOUNTER — Ambulatory Visit: Payer: Managed Care, Other (non HMO) | Admitting: Urology

## 2022-05-14 ENCOUNTER — Encounter: Payer: Self-pay | Admitting: Urology

## 2022-05-14 VITALS — BP 146/80 | HR 79 | Ht 69.0 in | Wt 200.0 lb

## 2022-05-14 DIAGNOSIS — Z8042 Family history of malignant neoplasm of prostate: Secondary | ICD-10-CM | POA: Diagnosis not present

## 2022-05-14 DIAGNOSIS — Z8639 Personal history of other endocrine, nutritional and metabolic disease: Secondary | ICD-10-CM | POA: Diagnosis not present

## 2022-05-14 DIAGNOSIS — N400 Enlarged prostate without lower urinary tract symptoms: Secondary | ICD-10-CM

## 2022-05-14 NOTE — Progress Notes (Signed)
   05/14/2022 1:21 PM   Dean Robinson [birth year] [id number]  Referring provider: Kirk Ruths, MD [facility] [place],  [place] [address]  Chief Complaint  Patient presents with   Follow-up    Urologic history:   1.  Hypogonadism -Symptoms tiredness, fatigue -Low normal testosterone levels improved on Clomid with symptom improvement   2.  Erectile dysfunction -Tadalafil prn  HPI: 47 y.o. male presents for annual follow-up.  Remains off Clomid and feels well without recurrent low T symptoms Labs 05/11/2022: Testosterone 317 ng/dL; PSA 0.6   PMH: Past Medical History:  Diagnosis Date   Asthma    Low testosterone     Surgical History: History reviewed. No pertinent surgical history.  Home Medications:  Allergies as of 05/14/2022   No Known Allergies      Medication List        Accurate as of May 14, 2022  1:21 PM. If you have any questions, ask your nurse or doctor.          STOP taking these medications    clomiPHENE 50 MG tablet Commonly known as: CLOMID Stopped by: Abbie Sons, MD       TAKE these medications    Advair HFA 115-21 MCG/ACT inhaler Generic drug: fluticasone-salmeterol Inhale 2 puffs into the lungs 2 (two) times daily.   albuterol 108 (90 Base) MCG/ACT inhaler Commonly known as: VENTOLIN HFA Inhale into the lungs.   cyclobenzaprine 10 MG tablet Commonly known as: FLEXERIL Take 1 tablet (10 mg total) by mouth 3 (three) times daily as needed for muscle spasms.   fexofenadine 180 MG tablet Commonly known as: ALLEGRA Take 180 mg by mouth daily.   fluticasone 50 MCG/ACT nasal spray Commonly known as: FLONASE Place into the nose.   Loratadine 10 MG Caps Take by mouth.   montelukast 10 MG tablet Commonly known as: SINGULAIR Take 10 mg by mouth daily.   naproxen 500 MG tablet Commonly known as: Naprosyn Take 1 tablet (500 mg total) by mouth 2 (two) times daily with a meal.    tadalafil 20 MG tablet Commonly known as: CIALIS 1 tab 1 hour prior to intercourse        Allergies: No Known Allergies  Family History: Family History  Problem Relation Age of Onset   Prostate cancer Father    Prostate cancer Paternal Uncle        x 5   Kidney disease Neg Hx     Social History:  reports that he has never smoked. He has never used smokeless tobacco. He reports that he does not drink alcohol and does not use drugs.   Physical Exam: BP (!) 146/80   Pulse 79   Ht 5\' 9"  (1.753 m)   Wt 200 lb (90.7 kg)   BMI 29.53 kg/m   Constitutional:  Alert and oriented, No acute distress. GU: Prostate 30 g, smooth without nodules Psychiatric: Normal mood and affect.   Assessment & Plan:    1.  Family history prostate cancer He desires to continue annual visits for PSA  2.  History of hypogonadism Testosterone level low normal He is feeling well and has no symptoms Repeat testosterone 1 year    Abbie Sons, MD  [facility] [address] [place], [place] [address] [phone]

## 2023-05-11 ENCOUNTER — Other Ambulatory Visit: Payer: Managed Care, Other (non HMO)

## 2023-05-13 ENCOUNTER — Ambulatory Visit: Payer: Managed Care, Other (non HMO) | Admitting: Urology

## 2023-05-18 ENCOUNTER — Other Ambulatory Visit: Payer: Self-pay

## 2023-05-18 DIAGNOSIS — Z8639 Personal history of other endocrine, nutritional and metabolic disease: Secondary | ICD-10-CM

## 2023-05-18 DIAGNOSIS — E291 Testicular hypofunction: Secondary | ICD-10-CM

## 2023-05-31 ENCOUNTER — Other Ambulatory Visit: Payer: Self-pay

## 2023-05-31 DIAGNOSIS — Z8639 Personal history of other endocrine, nutritional and metabolic disease: Secondary | ICD-10-CM

## 2023-05-31 DIAGNOSIS — E291 Testicular hypofunction: Secondary | ICD-10-CM

## 2023-06-01 LAB — TESTOSTERONE: Testosterone: 415 ng/dL (ref 264–916)

## 2023-06-01 LAB — PSA: Prostate Specific Ag, Serum: 0.7 ng/mL (ref 0.0–4.0)

## 2023-06-04 ENCOUNTER — Ambulatory Visit: Payer: Managed Care, Other (non HMO) | Admitting: Urology

## 2023-06-04 ENCOUNTER — Encounter: Payer: Self-pay | Admitting: Urology

## 2023-06-04 VITALS — BP 140/90 | HR 78 | Ht 69.0 in | Wt 196.0 lb

## 2023-06-04 DIAGNOSIS — Z8639 Personal history of other endocrine, nutritional and metabolic disease: Secondary | ICD-10-CM

## 2023-06-04 DIAGNOSIS — Z8042 Family history of malignant neoplasm of prostate: Secondary | ICD-10-CM

## 2023-06-04 NOTE — Progress Notes (Signed)
 I, Maysun Anabel Bene, acting as a scribe for Riki Altes, MD., have documented all relevant documentation on the behalf of Riki Altes, MD, as directed by Riki Altes, MD while in the presence of Riki Altes, MD.  06/04/2023 1:36 PM   Dean Robinson 22-Jan-1976 [id number]  Referring provider: Lauro Regulus, MD [address] [facility] - [place],  [place] [address]  Chief Complaint  Patient presents with   Follow-up   Urologic history: 1.  Hypogonadism Symptoms tiredness, fatigue Low normal testosterone levels improved on Clomid with symptom improvement Clomid discontinued 2023   2.  Erectile dysfunction Tadalafil prn  HPI: Dean Robinson is a 48 y.o. male presents for annual follow-up.   No problem since last year's visit and has no low T symptoms.  Labs 05/31/23 testosterone 415; PSA 0.7   PSA trend   Prostate Specific Ag, Serum  Latest Ref Rng 0.0 - 4.0 ng/mL  10/20/2019 0.7   04/12/2020 0.6   10/14/2020 0.6   04/10/2021 0.8   05/11/2022 0.6   05/31/2023 0.7      PMH: Past Medical History:  Diagnosis Date   Asthma    Low testosterone     Home Medications:  Allergies as of 06/04/2023   No Known Allergies      Medication List        Accurate as of June 04, 2023  1:36 PM. If you have any questions, ask your nurse or doctor.          STOP taking these medications    cyclobenzaprine 10 MG tablet Commonly known as: FLEXERIL Stopped by: Verna Czech Joannah Gitlin   fluticasone 50 MCG/ACT nasal spray Commonly known as: FLONASE Stopped by: Riki Altes   Loratadine 10 MG Caps Stopped by: Riki Altes   naproxen 500 MG tablet Commonly known as: Naprosyn Stopped by: Riki Altes       TAKE these medications    Advair HFA 115-21 MCG/ACT inhaler Generic drug: fluticasone-salmeterol Inhale 2 puffs into the lungs 2 (two) times daily.   albuterol 108 (90 Base) MCG/ACT inhaler Commonly known as: VENTOLIN  HFA Inhale into the lungs.   albuterol 108 (90 Base) MCG/ACT inhaler Commonly known as: VENTOLIN HFA Inhale into the lungs.   benzonatate 100 MG capsule Commonly known as: TESSALON Take by mouth.   fexofenadine 180 MG tablet Commonly known as: ALLEGRA Take 180 mg by mouth daily.   methylPREDNISolone 4 MG Tbpk tablet Commonly known as: MEDROL DOSEPAK See admin instructions.   montelukast 10 MG tablet Commonly known as: SINGULAIR Take 10 mg by mouth daily.   tadalafil 20 MG tablet Commonly known as: CIALIS 1 tab 1 hour prior to intercourse        Allergies: No Known Allergies  Family History: Family History  Problem Relation Age of Onset   Prostate cancer Father    Prostate cancer Paternal Uncle        x 5   Kidney disease Neg Hx     Social History:  reports that he has never smoked. He has never used smokeless tobacco. He reports that he does not drink alcohol and does not use drugs.   Physical Exam: BP (!) 140/90   Pulse 78   Ht 5\' 9"  (1.753 m)   Wt 196 lb (88.9 kg)   BMI 28.94 kg/m   Constitutional:  Alert and oriented, No acute distress. HEENT: [place] AT Respiratory: Normal respiratory effort,  no increased work of breathing. Psychiatric: Normal mood and affect.  Assessment & Plan:    1. Family history of prostate cancer Offered to continue annual visits versus having Dr. Dareen Piano perform his PSA annually and to return here for any changes. Will tentatively make him a follow-up 1 year visit, however he will discuss with Dr. Dareen Piano the above.   2. History of hypogonadism Testosterone level normal 415   I have reviewed the above documentation for accuracy and completeness, and I agree with the above.   Riki Altes, MD  [facility] [address], [address] [place], [place] [address] [phone]

## 2024-06-02 ENCOUNTER — Ambulatory Visit: Payer: Managed Care, Other (non HMO) | Admitting: Urology

## 2024-06-09 ENCOUNTER — Ambulatory Visit: Admitting: Urology
